# Patient Record
Sex: Male | Born: 1965 | Race: White | Hispanic: No | Marital: Single | Smoking: Former smoker
Health system: Southern US, Community
[De-identification: ages and names within clinical notes are randomized; demographics above are authoritative.]

## PROBLEM LIST (undated history)

## (undated) DIAGNOSIS — I10 Essential (primary) hypertension: Secondary | ICD-10-CM

## (undated) HISTORY — PX: CHOLECYSTECTOMY: SHX55

## (undated) HISTORY — PX: SMALL INTESTINE SURGERY: SHX150

---

## 2015-01-05 ENCOUNTER — Other Ambulatory Visit (HOSPITAL_COMMUNITY): Payer: Self-pay | Admitting: Orthopedic Surgery

## 2015-01-05 ENCOUNTER — Ambulatory Visit (HOSPITAL_COMMUNITY)
Admission: RE | Admit: 2015-01-05 | Discharge: 2015-01-05 | Disposition: A | Discharge status: Home / Self Care | Payer: BC Managed Care – PPO | Source: Ambulatory Visit | Attending: Orthopedic Surgery | Admitting: Orthopedic Surgery

## 2015-01-05 DIAGNOSIS — M545 Low back pain: Secondary | ICD-10-CM | POA: Insufficient documentation

## 2015-01-05 DIAGNOSIS — Z01818 Encounter for other preprocedural examination: Secondary | ICD-10-CM | POA: Diagnosis not present

## 2015-01-13 ENCOUNTER — Encounter (HOSPITAL_COMMUNITY): Payer: Self-pay | Admitting: *Deleted

## 2015-01-13 ENCOUNTER — Emergency Department (HOSPITAL_COMMUNITY)
Admission: EM | Admit: 2015-01-13 | Discharge: 2015-01-13 | Disposition: A | Discharge status: Home / Self Care | Payer: BC Managed Care – PPO | Attending: Emergency Medicine | Admitting: Emergency Medicine

## 2015-01-13 ENCOUNTER — Emergency Department (HOSPITAL_COMMUNITY): Payer: BC Managed Care – PPO

## 2015-01-13 DIAGNOSIS — Z87891 Personal history of nicotine dependence: Secondary | ICD-10-CM | POA: Diagnosis not present

## 2015-01-13 DIAGNOSIS — Z880 Allergy status to penicillin: Secondary | ICD-10-CM | POA: Insufficient documentation

## 2015-01-13 DIAGNOSIS — M545 Low back pain, unspecified: Secondary | ICD-10-CM

## 2015-01-13 DIAGNOSIS — Z79899 Other long term (current) drug therapy: Secondary | ICD-10-CM | POA: Insufficient documentation

## 2015-01-13 DIAGNOSIS — M549 Dorsalgia, unspecified: Secondary | ICD-10-CM | POA: Diagnosis present

## 2015-01-13 DIAGNOSIS — Z791 Long term (current) use of non-steroidal anti-inflammatories (NSAID): Secondary | ICD-10-CM | POA: Diagnosis not present

## 2015-01-13 DIAGNOSIS — R52 Pain, unspecified: Secondary | ICD-10-CM

## 2015-01-13 DIAGNOSIS — I10 Essential (primary) hypertension: Secondary | ICD-10-CM | POA: Insufficient documentation

## 2015-01-13 HISTORY — DX: Essential (primary) hypertension: I10

## 2015-01-13 MED ORDER — HYDROMORPHONE HCL 1 MG/ML IJ SOLN
1.0000 mg | Freq: Once | INTRAMUSCULAR | Status: AC
Start: 2015-01-13 — End: 2015-01-13
  Administered 2015-01-13: 1 mg via INTRAMUSCULAR
  Filled 2015-01-13: qty 1

## 2015-01-13 MED ORDER — CYCLOBENZAPRINE HCL 10 MG PO TABS
10.0000 mg | ORAL_TABLET | Freq: Once | ORAL | Status: AC
  Administered 2015-01-13: 10 mg via ORAL
  Filled 2015-01-13: qty 1

## 2015-01-13 MED ORDER — CYCLOBENZAPRINE HCL 10 MG PO TABS: 10.0000 mg | ORAL_TABLET | Freq: Three times a day (TID) | ORAL | Status: AC | PRN

## 2015-01-13 MED ORDER — KETOROLAC TROMETHAMINE 60 MG/2ML IM SOLN
60.0000 mg | Freq: Once | INTRAMUSCULAR | Status: AC
  Administered 2015-01-13: 60 mg via INTRAMUSCULAR
  Filled 2015-01-13: qty 2

## 2015-01-13 NOTE — ED Provider Notes (Signed)
CSN: [id number]     Arrival date & time 01/13/15  0900 History   First MD Initiated Contact with Patient 01/13/15 [phone]     Chief Complaint  Patient presents with  . Back Pain     (Consider location/radiation/quality/duration/timing/severity/associated sxs/prior Treatment) HPI Comments: Back pain x3-4 months, received prednisone, however since day after thanks giving has been severe Went to [facility], has MRI ordered for tomorrow Unbearable pain Jumped off of 2 steps on Wednesday, pain became more severe Right leg for last 3 weeks, throbbing, knots, right gluteus down to lower leg Sitting up makes it worse, Standing helps, however transition between worse No numbness/weakness/loss control bowel/bladder No hx of chronic steroids, IVDU, cancer Meloxicam Out of hydrocodone    Patient is a 49 y.o. male presenting with back pain.  Back Pain Associated symptoms: no abdominal pain, no chest pain, no fever and no headaches     Past Medical History  Diagnosis Date  . Hypertension    Past Surgical History  Procedure Laterality Date  . Cholecystectomy    . Small intestine surgery      49 years old, for diverticulum   History reviewed. No pertinent family history. Social History  Substance Use Topics  . Smoking status: Former Smoker -- 2.00 packs/day for 30 years    Types: Cigarettes  . Smokeless tobacco: None  . Alcohol Use: No    Review of Systems  Constitutional: Negative for fever.  HENT: Negative for sore throat.   Eyes: Negative for visual disturbance.  Respiratory: Negative for shortness of breath.   Cardiovascular: Negative for chest pain.  Gastrointestinal: Negative for abdominal pain.  Genitourinary: Negative for difficulty urinating.  Musculoskeletal: Positive for back pain. Negative for neck stiffness.  Skin: Negative for rash.  Neurological: Negative for syncope and headaches.      Allergies  Penicillins  Home Medications   Prior to Admission  medications   Medication Sig Start Date End Date Taking? Authorizing Provider  acetaminophen (TYLENOL) 500 MG tablet Take 1,000 mg by mouth every 6 (six) hours as needed for moderate pain.   Yes Historical Provider, MD  chlorthalidone (HYGROTON) 25 MG tablet Take 25 mg by mouth daily. 01/04/15  Yes Historical Provider, MD  HYDROcodone-acetaminophen (NORCO) 10-325 MG tablet Take 1 tablet by mouth every 8 (eight) hours as needed for moderate pain.   Yes Historical Provider, MD  lisinopril (PRINIVIL,ZESTRIL) 10 MG tablet Take 10 mg by mouth daily. 01/04/15  Yes Historical Provider, MD  meloxicam (MOBIC) 7.5 MG tablet Take 7.5 mg by mouth daily. 12/31/14  Yes Historical Provider, MD  propranolol ER (INDERAL LA) 60 MG 24 hr capsule Take 60 mg by mouth daily. 01/04/15  Yes Historical Provider, MD  cyclobenzaprine (FLEXERIL) 10 MG tablet Take 1 tablet (10 mg total) by mouth 3 (three) times daily as needed for muscle spasms. 01/13/15   Alvira Monday, MD   BP 111/68 mmHg  Pulse 98  Temp(Src) 98.5 F (36.9 C) (Oral)  Resp 16  SpO2 95% Physical Exam  Constitutional: He is oriented to person, place, and time. He appears well-developed and well-nourished. No distress.  HENT:  Head: Normocephalic and atraumatic.  Eyes: Conjunctivae and EOM are normal.  Neck: Normal range of motion.  Cardiovascular: Normal rate, regular rhythm, normal heart sounds and intact distal pulses.  Exam reveals no gallop and no friction rub.   No murmur heard. Pulmonary/Chest: Effort normal and breath sounds normal. No respiratory distress. He has no wheezes. He has no  rales.  Abdominal: Soft. He exhibits no distension. There is no tenderness. There is no guarding.  Musculoskeletal: He exhibits no edema.  Neurological: He is alert and oriented to person, place, and time. He has normal strength. No sensory deficit.  5/5 strength  Skin: Skin is warm and dry. He is not diaphoretic.  Nursing note and vitals reviewed.   ED  Course  Procedures (including critical care time) Labs Review Labs Reviewed - No data to display  Imaging Review Dg Lumbar Spine Complete  01/13/2015  CLINICAL DATA:  Low back pain. EXAM: LUMBAR SPINE - COMPLETE 4+ VIEW COMPARISON:  None. FINDINGS: There is no evidence of lumbar spine fracture. Alignment is normal. Intervertebral disc spaces are maintained, except for L5-S1 which is mildly narrowed. There is mild atheromatous change of the aorta. No pars defects are seen. Surgical clips RIGHT upper quadrant suspected cholecystectomy. IMPRESSION: No acute findings.  Mild disc space narrowing L5-S1. Electronically Signed   By: Elsie StainJohn T Curnes M.D.   On: 01/13/2015 11:34   I have personally reviewed and evaluated these images and lab results as part of my medical decision-making.   EKG Interpretation None      MDM   Final diagnoses:  Midline low back pain without sciatica   49yo male with history of htn and back pain over months presents with worsening back pain. Patient has a normal neurologic exam and denies any urinary retention or overflow incontinence, stool incontinence, saddle anesthesia, fever, IV drug use, trauma, chronic steroid use or immunocompromise and have low suspicion suspicion for cauda equina, epidural abscess, or vertebral osteomyelitis.  He reports pain worsening after large step off 2 steps on Wednesday and XR ordered showing no acute fx. No abd pain/urinary symptoms and doubt intraabdominal cause of pain.  Pt given dilaudid/flexeril/toradol for pain with improvement and rx for flexeril. Patient discharged in stable condition with understanding of reasons to return.     Alvira MondayErin Branden Shallenberger, MD 01/14/15 1109

## 2015-01-13 NOTE — Discharge Instructions (Signed)

## 2015-01-13 NOTE — ED Notes (Signed)
Pt reports chronic lower back pain, [facility] has xray which showed disc issues. Pt has MRI scheduled tomorrow. Pain has increased, so came to ED. Pain 9/10.  Denies bladder or bowel incontinence. Denies dysuria or hematuria.

## 2016-05-07 IMAGING — CR DG ORBITS FOR FOREIGN BODY
2 series · 2 of 2 positions shown · non-contrast
Comparison: None.

CLINICAL DATA: Metal working/exposure; clearance prior to MRI

EXAM:
ORBITS FOR FOREIGN BODY - 2 VIEW

[w waters (1 of 2)]
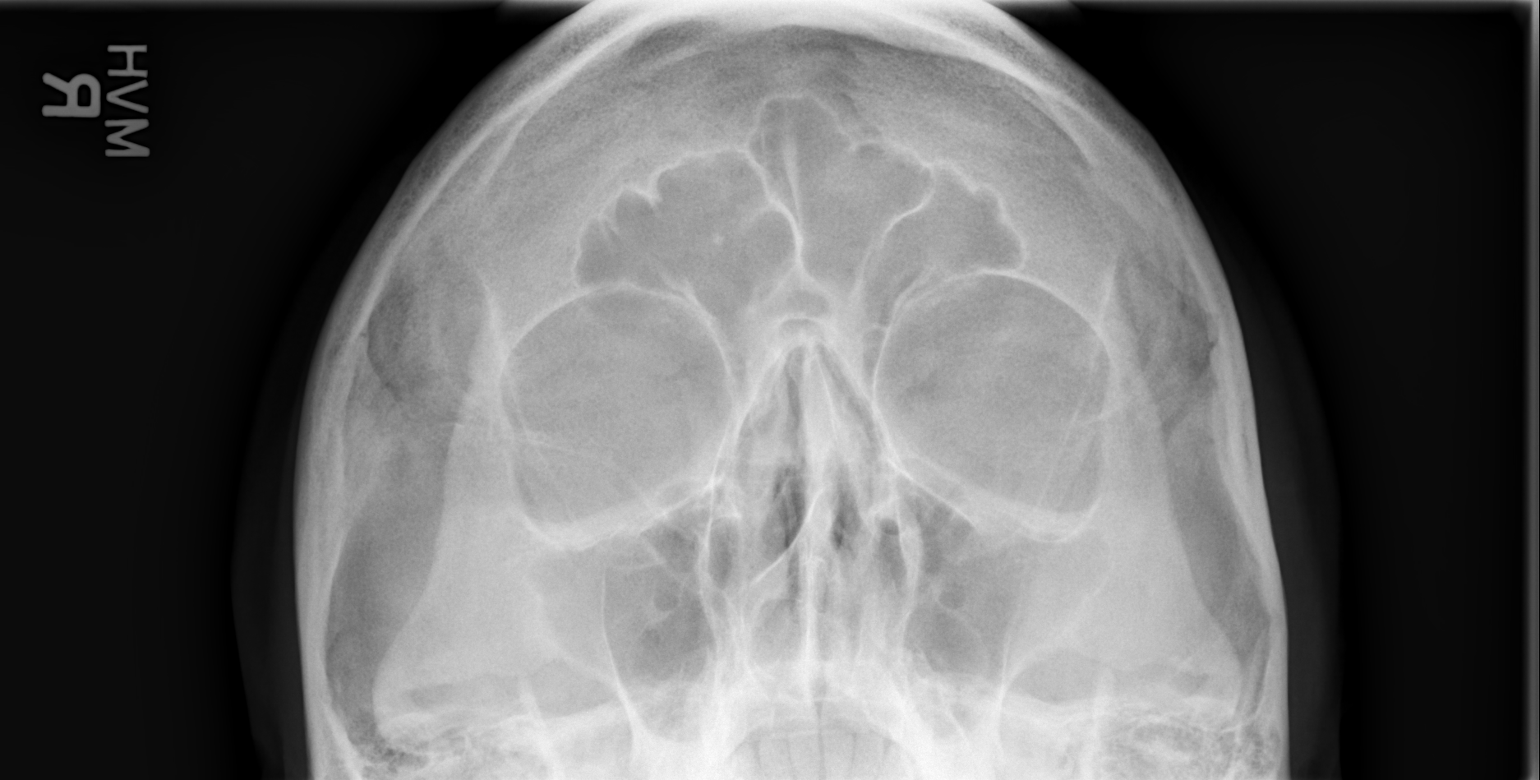

[w waters (2 of 2)]
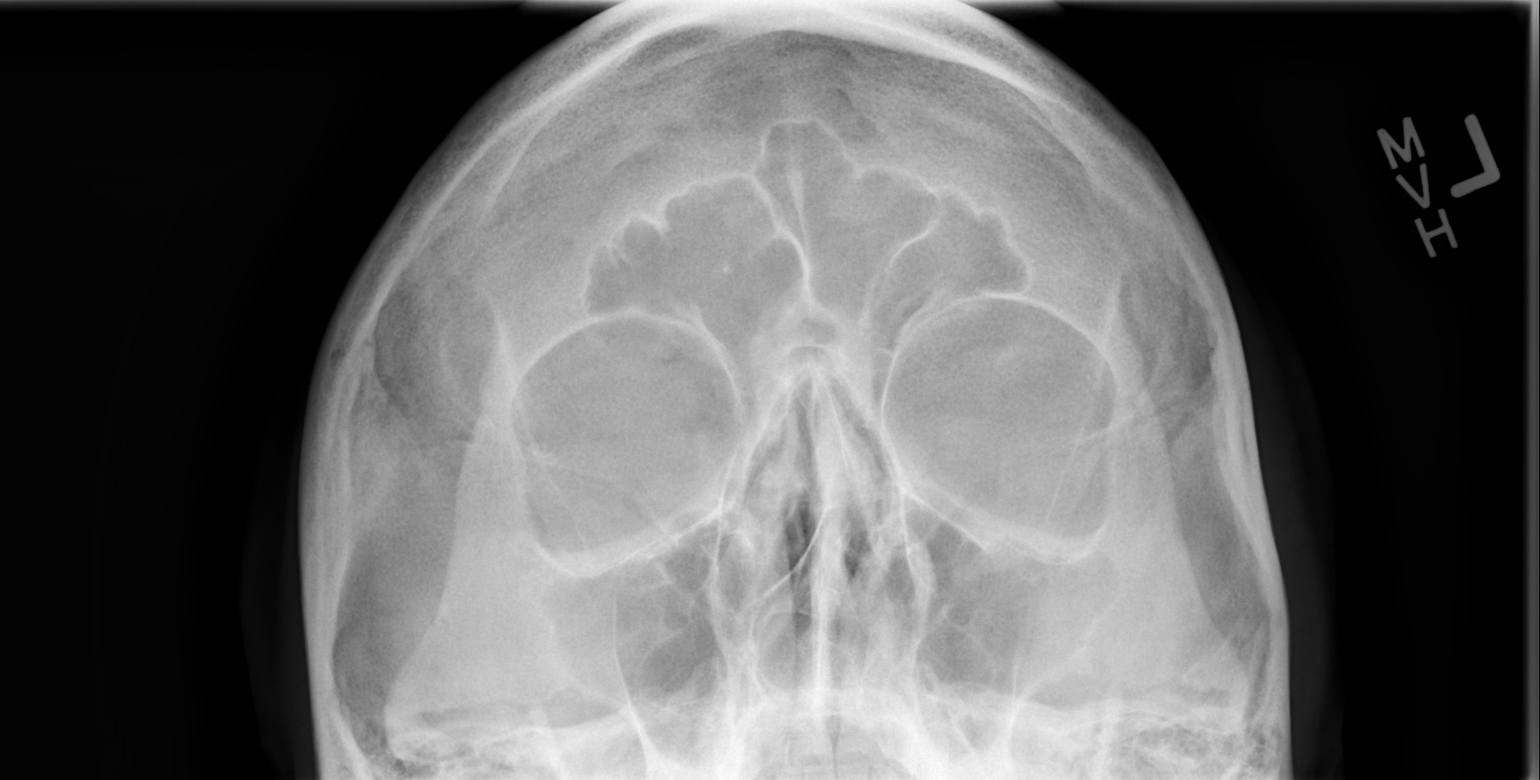

[2 of 2 positions shown; findings below may reference images not displayed]

FINDINGS: Water's views with eyes deviated toward the left and toward the
right were obtained. No intraorbital radiopaque foreign body.
Paranasal sinuses are clear. No fracture or dislocation.
IMPRESSION: No evidence of metallic foreign body within the orbits.
